# Patient Record
Sex: Male | Born: 1975 | Hispanic: Yes | Smoking: Never smoker
Health system: Southern US, Community
[De-identification: ages and names within clinical notes are randomized; demographics above are authoritative.]

---

## 2012-12-07 ENCOUNTER — Emergency Department: Payer: Self-pay | Admitting: Emergency Medicine

## 2012-12-07 LAB — URINALYSIS, COMPLETE
Bilirubin,UR: NEGATIVE
Glucose,UR: NEGATIVE mg/dL (ref 0–75)
Ketone: NEGATIVE
Nitrite: NEGATIVE
Ph: 5 (ref 4.5–8.0)
Protein: 100
Specific Gravity: 1.029 (ref 1.003–1.030)
Squamous Epithelial: 1
WBC UR: 5 /HPF (ref 0–5)

## 2012-12-07 LAB — COMPREHENSIVE METABOLIC PANEL
Albumin: 4.7 g/dL (ref 3.4–5.0)
Alkaline Phosphatase: 117 U/L (ref 50–136)
Anion Gap: 9 (ref 7–16)
Bilirubin,Total: 0.4 mg/dL (ref 0.2–1.0)
Calcium, Total: 9.2 mg/dL (ref 8.5–10.1)
Creatinine: 1.05 mg/dL (ref 0.60–1.30)
Glucose: 143 mg/dL — ABNORMAL HIGH (ref 65–99)
Osmolality: 279 (ref 275–301)
Potassium: 3.5 mmol/L (ref 3.5–5.1)
SGOT(AST): 28 U/L (ref 15–37)
Sodium: 138 mmol/L (ref 136–145)

## 2012-12-07 LAB — LIPASE, BLOOD: Lipase: 105 U/L (ref 73–393)

## 2012-12-07 LAB — CBC
HGB: 14.5 g/dL (ref 13.0–18.0)
MCH: 30.5 pg (ref 26.0–34.0)
MCHC: 34.2 g/dL (ref 32.0–36.0)
MCV: 89 fL (ref 80–100)
Platelet: 227 10*3/uL (ref 150–440)
RDW: 12.8 % (ref 11.5–14.5)
WBC: 12.6 10*3/uL — ABNORMAL HIGH (ref 3.8–10.6)

## 2014-01-18 IMAGING — US US RENAL KIDNEY
1 series · 14 of 25 positions shown · non-contrast
Comparison: none

REASON FOR EXAM: pain - left flank - hematuria - eval for
hydronephrosis/stone
COMMENTS:

PROCEDURE:     US  - US KIDNEY  - December 07, 2012  [DATE]
RESULT:     Comparison: None.
TECHNIQUE: Multiple grayscale and color Doppler images were obtained of the
bilateral kidneys.

[Series 1: us renal kidney · 0.25mm/px · 14 of 52 slices shown]
[im 1/52]
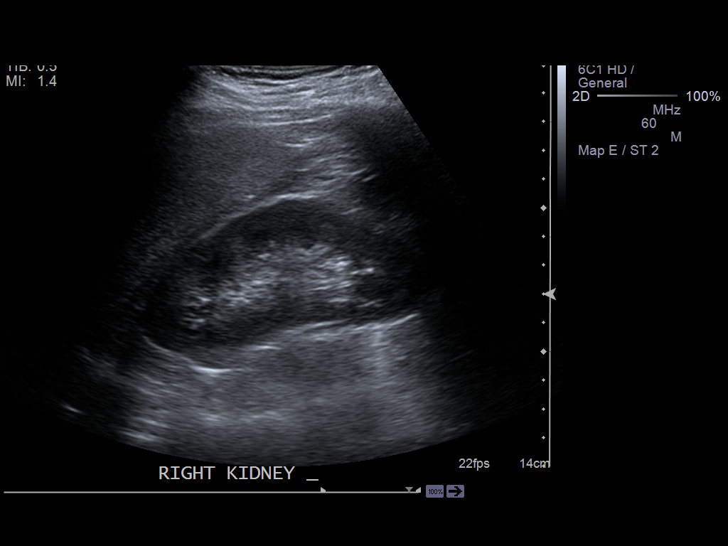
[im 5/52]
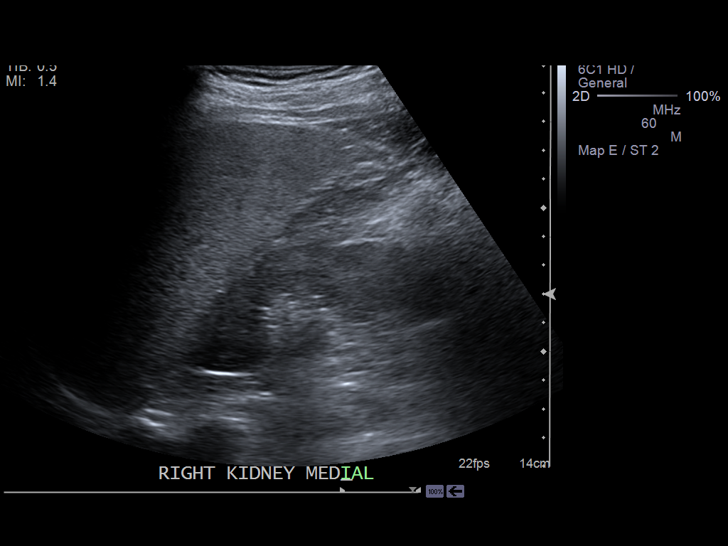
[im 9/52]
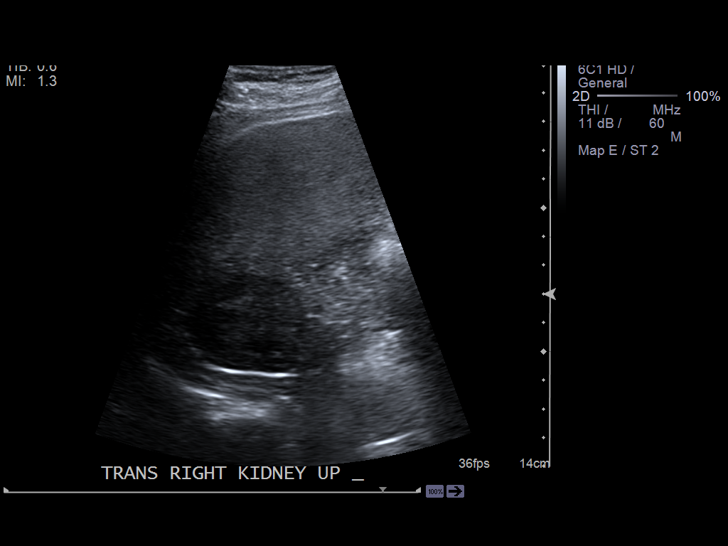
[im 13/52]
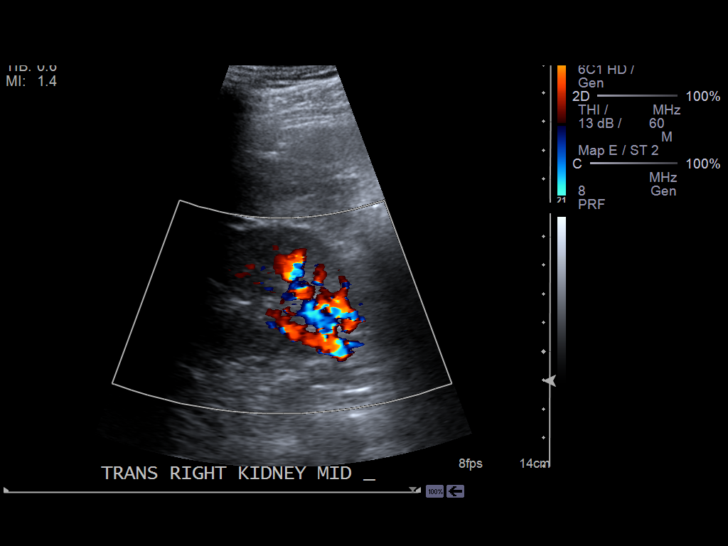
[im 18/52]
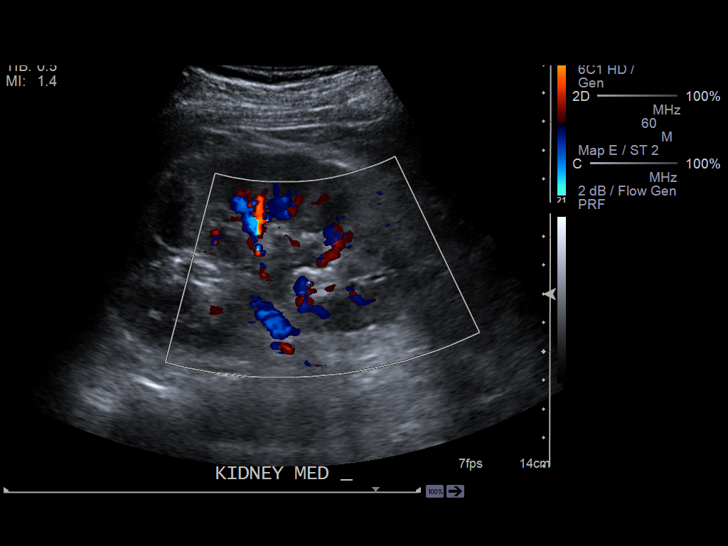
[im 20/52]
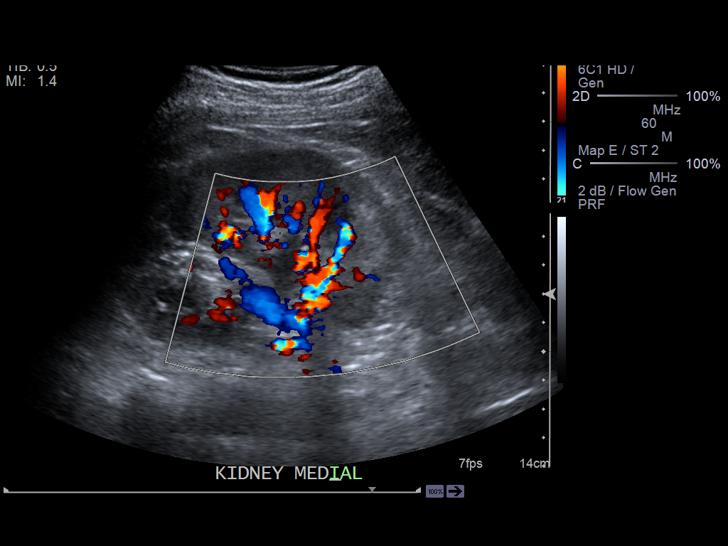
[im 24/52]
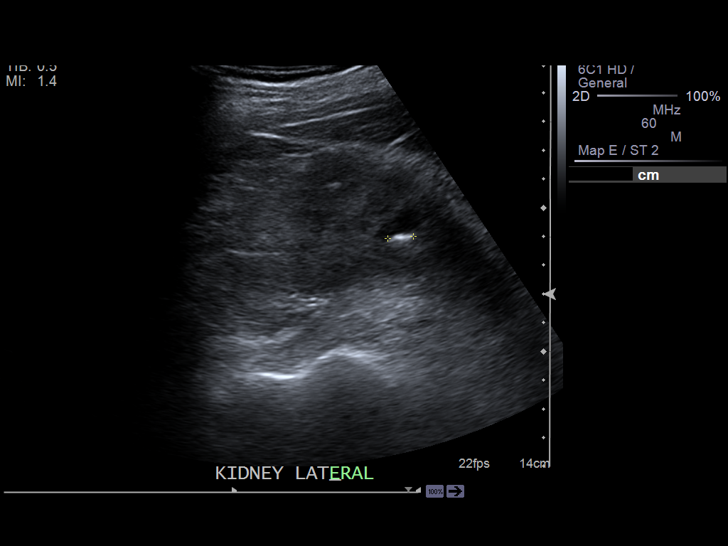
[im 28/52]
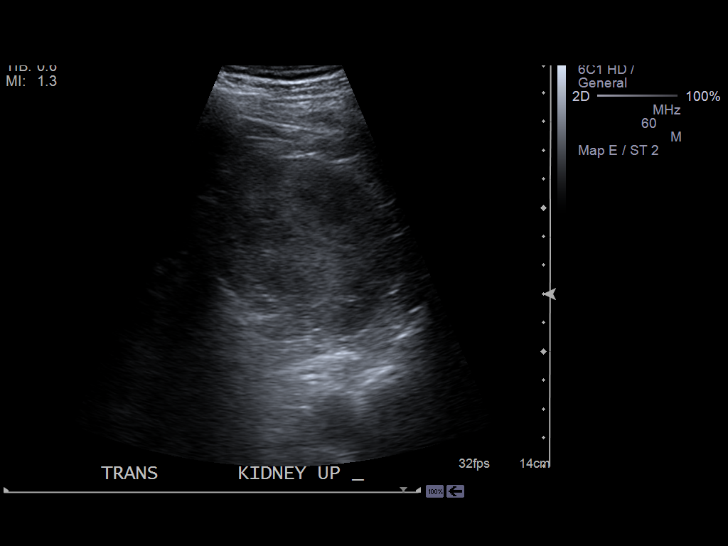
[im 32/52]
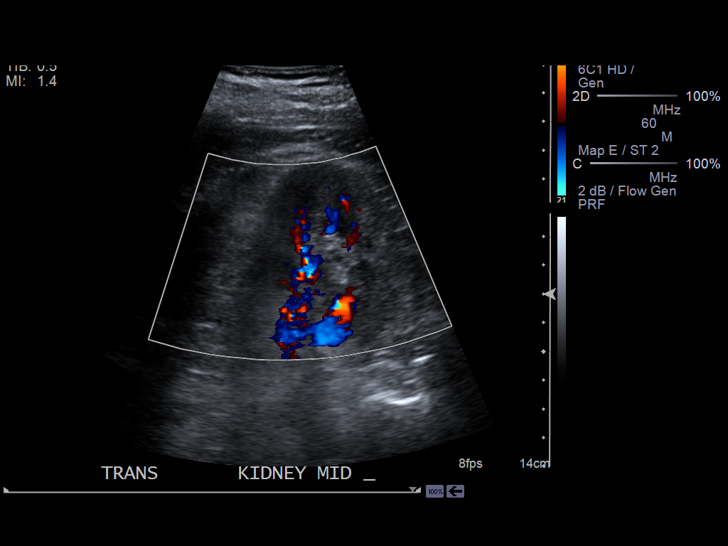
[im 35/52]
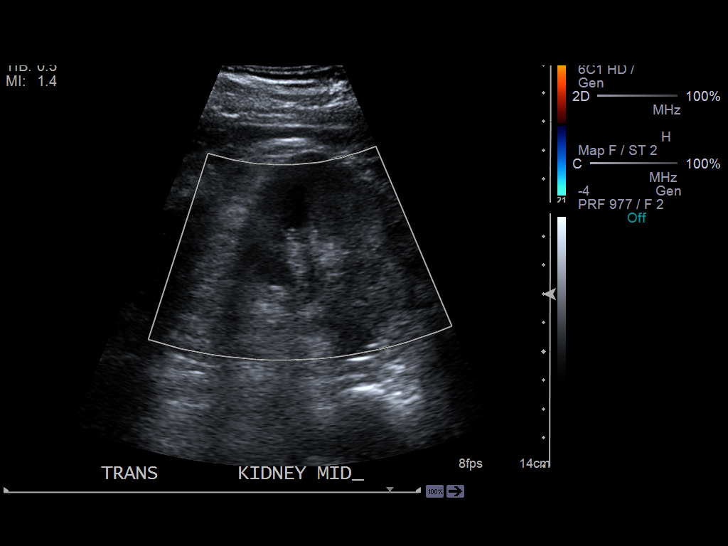
[im 39/52]
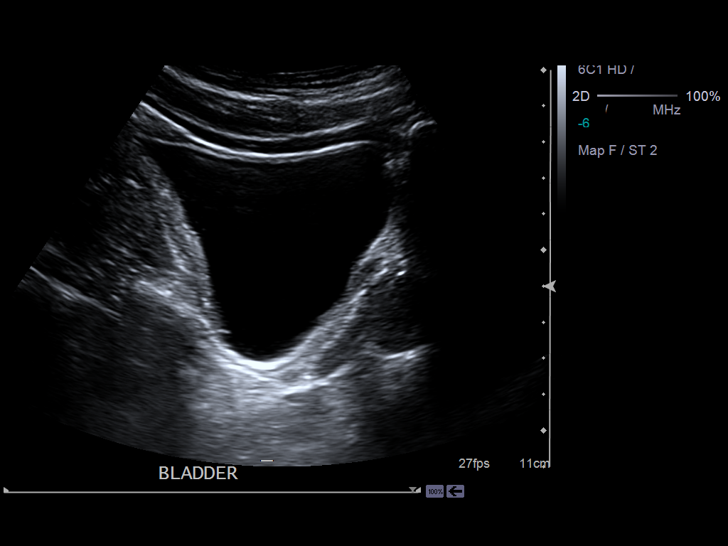
[im 43/52]
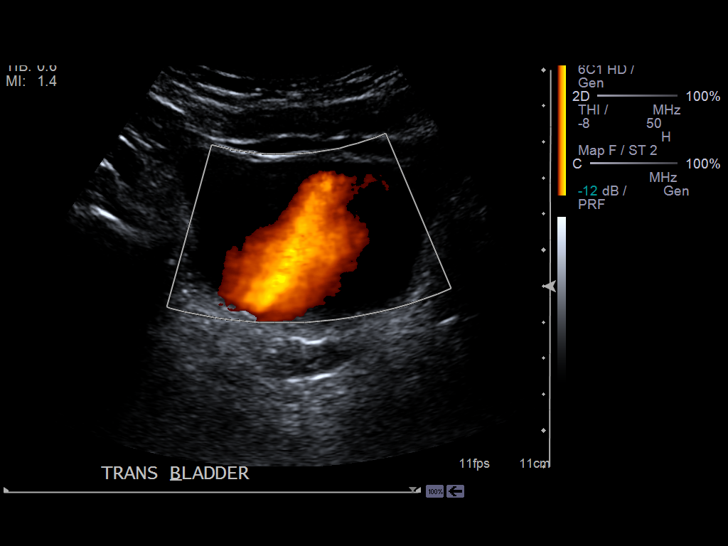
[im 47/52]
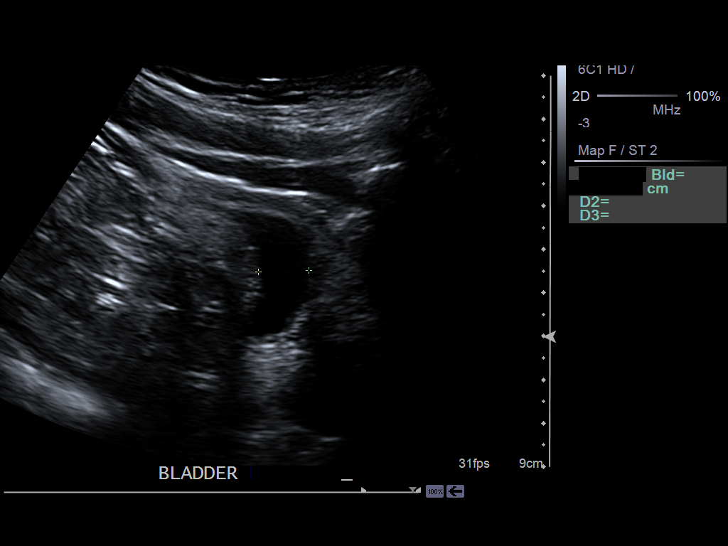
[im 52/52]
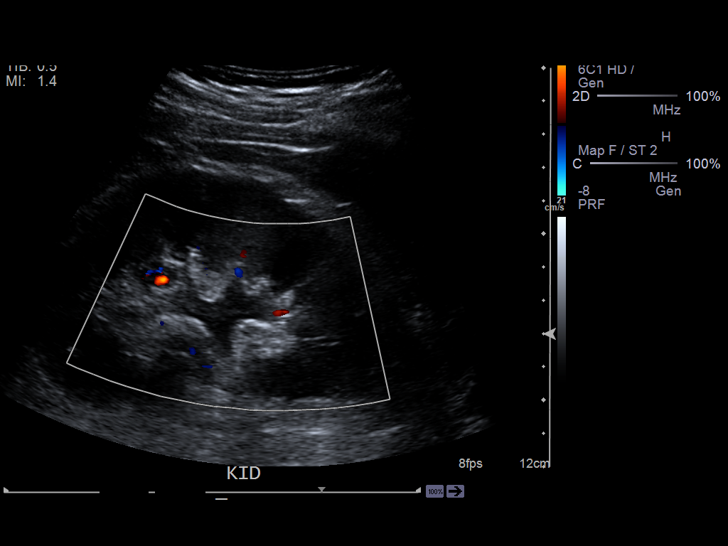

[14 of 25 positions shown; findings below may reference images not displayed]

FINDINGS: The right kidney measures 10.2 x 4.9 x 4.8 cm. The left kidney measures
x 5.0 x 4.6 cm. There is mild pelviectasis in the left kidney. This
persisted on the post void images. There are a few subcentimeter hyperechoic
foci within the left kidney. These do not demonstrate definite shadowing,
but may represent small calculi. The largest measures 9 mm. Bilateral
ureteral jets were seen. The post void volume was 4 mm.
IMPRESSION: 1. Mild pelviectasis, which is nonspecific. Clinical correlation for urinary
obstruction is suggested. Followup renal ultrasound could be performed.
2. Findings which may represent left-sided nephrolithiasis.

[REDACTED]

## 2015-09-11 ENCOUNTER — Emergency Department: Payer: Self-pay

## 2015-09-11 ENCOUNTER — Encounter: Payer: Self-pay | Admitting: Emergency Medicine

## 2015-09-11 ENCOUNTER — Emergency Department
Admission: EM | Admit: 2015-09-11 | Discharge: 2015-09-12 | Disposition: A | Discharge status: Home / Self Care | Payer: Self-pay | Attending: Emergency Medicine | Admitting: Emergency Medicine

## 2015-09-11 DIAGNOSIS — Y9289 Other specified places as the place of occurrence of the external cause: Secondary | ICD-10-CM | POA: Insufficient documentation

## 2015-09-11 DIAGNOSIS — Y990 Civilian activity done for income or pay: Secondary | ICD-10-CM | POA: Insufficient documentation

## 2015-09-11 DIAGNOSIS — Y9389 Activity, other specified: Secondary | ICD-10-CM | POA: Insufficient documentation

## 2015-09-11 DIAGNOSIS — W228XXA Striking against or struck by other objects, initial encounter: Secondary | ICD-10-CM | POA: Insufficient documentation

## 2015-09-11 DIAGNOSIS — S0003XA Contusion of scalp, initial encounter: Secondary | ICD-10-CM | POA: Insufficient documentation

## 2015-09-11 MED ORDER — ACETAMINOPHEN 325 MG PO TABS
ORAL_TABLET | ORAL | Status: AC
  Administered 2015-09-11: 650 mg via ORAL
  Filled 2015-09-11: qty 2

## 2015-09-11 MED ORDER — ACETAMINOPHEN 325 MG PO TABS
650.0000 mg | ORAL_TABLET | Freq: Once | ORAL | Status: AC
  Administered 2015-09-11: 650 mg via ORAL

## 2015-09-11 NOTE — ED Notes (Addendum)
Pt states while he was at work earlier this evening he was hit in the head with a long piece of metal. Redness and swelling noted to top left side of head. Pt alert and answering questions without difficulty. denies dizziness at this time but states was dizzy earlier. Pain was throbbing and he noticed pain to the upper part of his back. Denies loc. Came due to increase in pain to his head.

## 2015-09-11 NOTE — ED Provider Notes (Signed)
[facility] Emergency Department Provider Note  ____________________________________________  Time seen: 11:00 PM  I have reviewed the triage vital signs and the nursing notes.   HISTORY  Chief Complaint Head Injury      HPI Wayne Bass is a 39 y.o. male presents with history of accidental blunt trauma to the left side of his head approximately 6:00 PM today. Patient states while he was working on a car struck on the left side of the head with a long piece of metal. Patient admits to being "dazed at the time of incident but continues to have pain on the left side of his head currently 6 out of 10. She denies any weakness no numbness no nausea no vomiting no gait instability.     Past Medical History None There are no active problems to display for this patient.   Past surgical History None No current outpatient prescriptions on file.  Allergies No known drug allergies No family history on file.  Social History Social History  Substance Use Topics  . Smoking status: Never Smoker   . Smokeless tobacco: Never Used  . Alcohol Use: No    Review of Systems  Constitutional: Negative for fever. Eyes: Negative for visual changes. ENT: Negative for sore throat. Cardiovascular: Negative for chest pain. Respiratory: Negative for shortness of breath. Gastrointestinal: Negative for abdominal pain, vomiting and diarrhea. Genitourinary: Negative for dysuria. Musculoskeletal: Negative for back pain. Skin: Negative for rash. Neurological: Positive for left side headache   10-point ROS otherwise negative.  ____________________________________________   PHYSICAL EXAM:  VITAL SIGNS: ED Triage Vitals  Enc Vitals Group     BP 09/11/15 2215 146/96 mmHg     Pulse Rate 09/11/15 2215 75     Resp 09/11/15 2215 18     Temp 09/11/15 2215 98.2 F (36.8 C)     Temp Source 09/11/15 2215 Oral     SpO2 09/11/15 2215 95 %     Weight --      Height  09/11/15 2215 5' (1.524 m)     Head Cir --      Peak Flow --      Pain Score 09/11/15 2302 7     Pain Loc --      Pain Edu? --      Excl. in GC? --      Constitutional: Alert and oriented. Well appearing and in no distress. Eyes: Conjunctivae are normal. PERRL. Normal extraocular movements. ENT   Head: Normocephalic and atraumatic.   Nose: No congestion/rhinnorhea.   Mouth/Throat: Mucous membranes are moist.   Neck: No stridor. Cardiovascular: Normal rate, regular rhythm. Normal and symmetric distal pulses are present in all extremities. No murmurs, rubs, or gallops. Respiratory: Normal respiratory effort without tachypnea nor retractions. Breath sounds are clear and equal bilaterally. No wheezes/rales/rhonchi. Gastrointestinal: Soft and nontender. No distention. There is no CVA tenderness. Genitourinary: deferred Musculoskeletal: Nontender with normal range of motion in all extremities. No joint effusions.  No lower extremity tenderness nor edema. Neurologic:  Normal speech and language. No gross focal neurologic deficits are appreciated. Speech is normal.  Skin:  Skin is warm, dry and intact. No rash noted. Psychiatric: Mood and affect are normal. Speech and behavior are normal. Patient exhibits appropriate insight and judgment.      RADIOLOGY CT Head Wo Contrast (Final result) Result time: 09/12/15 00:05:51   Final result by Rad Results In Interface (09/12/15 00:05:51)   Narrative:   CLINICAL DATA: Struck in head with  metal at work. LEFT head swelling. No loss of consciousness. Increasing head pain and prior dizziness.  EXAM: CT HEAD WITHOUT CONTRAST  TECHNIQUE: Contiguous axial images were obtained from the base of the skull through the vertex without intravenous contrast.  COMPARISON: None.  FINDINGS: The ventricles and sulci are normal. No intraparenchymal hemorrhage, mass effect nor midline shift. No acute large vascular  territory infarcts.  No abnormal extra-axial fluid collections. Basal cisterns are patent.  Moderate LEFT frontal scalp hematoma without subcutaneous gas or radiopaque foreign bodies. No skull fracture. Probable epidermal inclusion cyst LEFT posterior temporal scalp, benign. The included ocular globes and orbital contents are non-suspicious. The mastoid aircells and included paranasal sinuses are well-aerated.  IMPRESSION: No acute intracranial process; normal noncontrast CT head.  Moderate LEFT frontal scalp hematoma. No skull fracture.   Electronically Signed By: Awilda Metro M.D. On: 09/12/2015 00:05      INITIAL IMPRESSION / ASSESSMENT AND PLAN / ED COURSE  Pertinent labs & imaging results that were available during my care of the patient were reviewed by me and considered in my medical decision making (see chart for details).    ____________________________________________   FINAL CLINICAL IMPRESSION(S) / ED DIAGNOSES  Final diagnoses:  Scalp contusion, initial encounter      Darci Current, MD 09/12/15 [phone]

## 2015-09-12 NOTE — Discharge Instructions (Signed)
Facial or Scalp Contusion A facial or scalp contusion is a deep bruise on the face or head. Injuries to the face and head generally cause a lot of swelling, especially around the eyes. Contusions are the result of an injury that caused bleeding under the skin. The contusion may turn blue, purple, or yellow. Minor injuries will give you a painless contusion, but more severe contusions may stay painful and swollen for a few weeks.  CAUSES  A facial or scalp contusion is caused by a blunt injury or trauma to the face or head area.  SIGNS AND SYMPTOMS   Swelling of the injured area.   Discoloration of the injured area.   Tenderness, soreness, or pain in the injured area.  DIAGNOSIS  The diagnosis can be made by taking a medical history and doing a physical exam. An X-ray exam, CT scan, or MRI may be needed to determine if there are any associated injuries, such as broken bones (fractures). TREATMENT  Often, the best treatment for a facial or scalp contusion is applying cold compresses to the injured area. Over-the-counter medicines may also be recommended for pain control.  HOME CARE INSTRUCTIONS   Only take over-the-counter or prescription medicines as directed by your health care provider.   Apply ice to the injured area.   Put ice in a plastic bag.   Place a towel between your skin and the bag.   Leave the ice on for 20 minutes, 2-3 times a day.  SEEK MEDICAL CARE IF:  You have bite problems.   You have pain with chewing.   You are concerned about facial defects. SEEK IMMEDIATE MEDICAL CARE IF:  You have severe pain or a headache that is not relieved by medicine.   You have unusual sleepiness, confusion, or personality changes.   You throw up (vomit).   You have a persistent nosebleed.   You have double vision or blurred vision.   You have fluid drainage from your nose or ear.   You have difficulty walking or using your arms or legs.  MAKE SURE YOU:    Understand these instructions.  Will watch your condition.  Will get help right away if you are not doing well or get worse. Document Released: 01/09/2005 Document Revised: 09/22/2013 Document Reviewed: 07/15/2013 [employer] Patient Information 2015 [employer]. This information is not intended to replace advice given to you by your health care provider. Make sure you discuss any questions you have with your health care provider.  

## 2016-10-22 IMAGING — CT CT HEAD W/O CM
2 series · 14 of 30 positions shown, 16 images · non-contrast
Comparison: None.

CLINICAL DATA: Struck in head with metal at work. LEFT head
swelling. No loss of consciousness. Increasing head pain and prior
dizziness.

EXAM:
CT HEAD WITHOUT CONTRAST
TECHNIQUE: Contiguous axial images were obtained from the base of the skull
through the vertex without intravenous contrast.

[Series 2: head wo · axial · 0.44mm/px · z∈[-127,-28]mm · 6 of 32 slices shown, 8 images]
[im 5/32  brain]
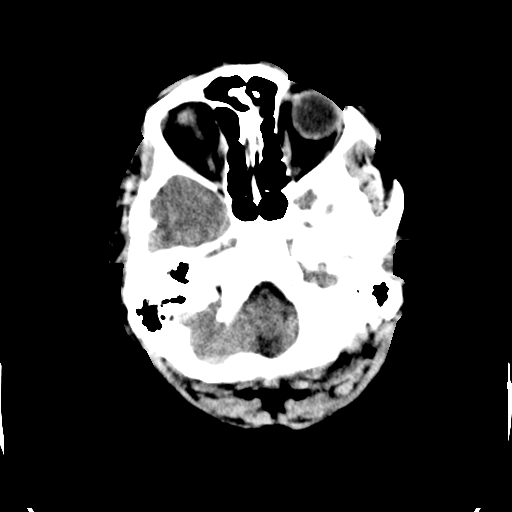
[im 5/32  bone]
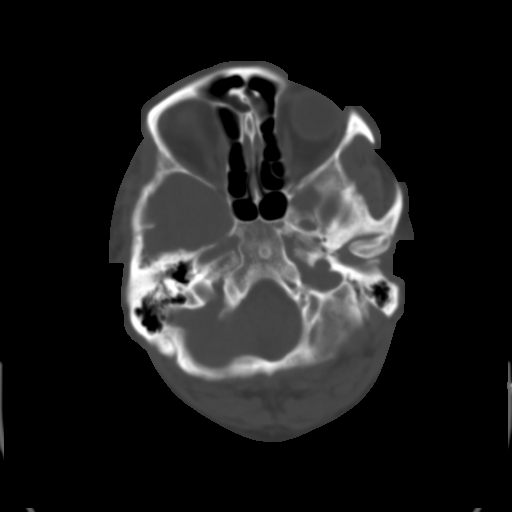
[im 9/32  brain]
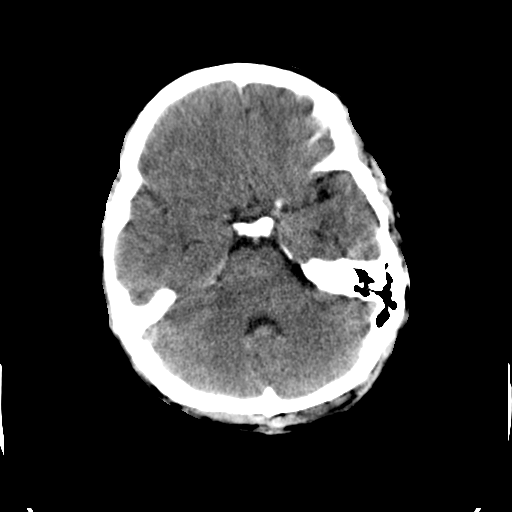
[im 14/32  brain]
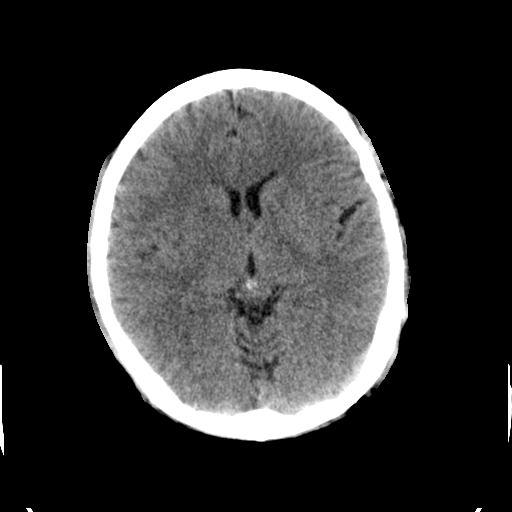
[im 18/32  brain]
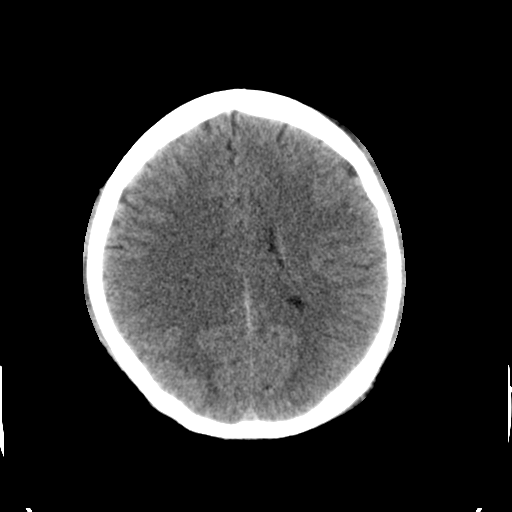
[im 23/32  brain]
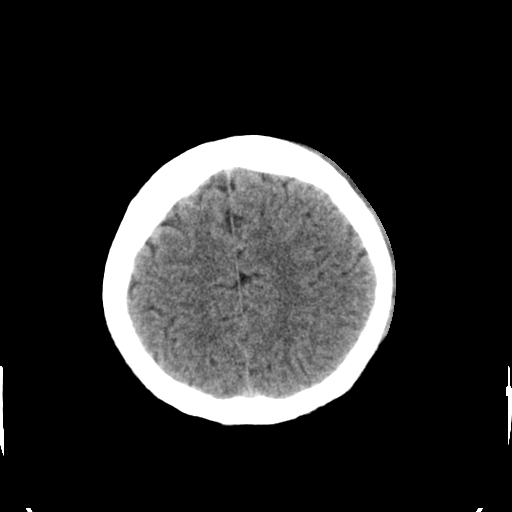
[im 23/32  bone]
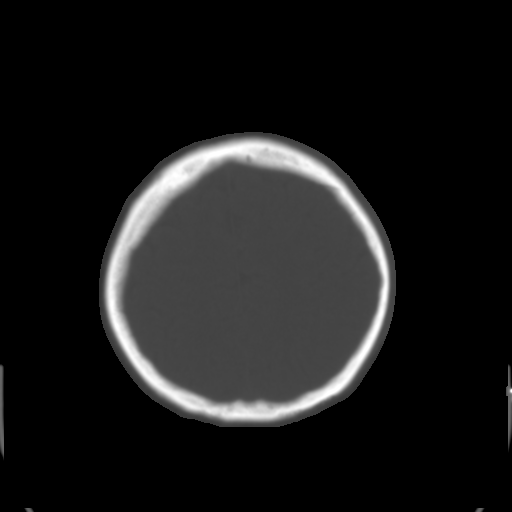
[im 27/32  brain]
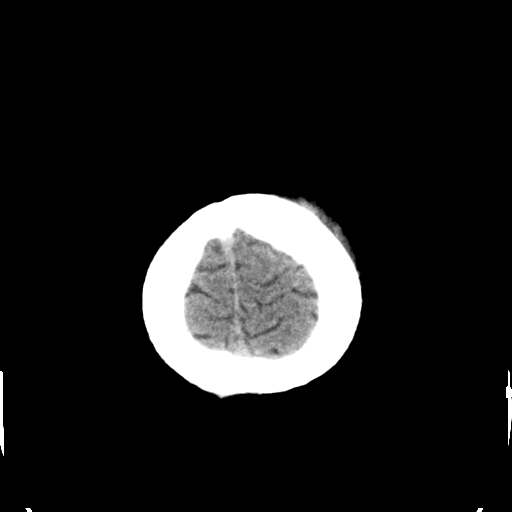

[Series 3: head bone · axial · 0.44mm/px · z∈[-133,-19]mm · 8 of 96 slices shown]
[im 10/96  bone]
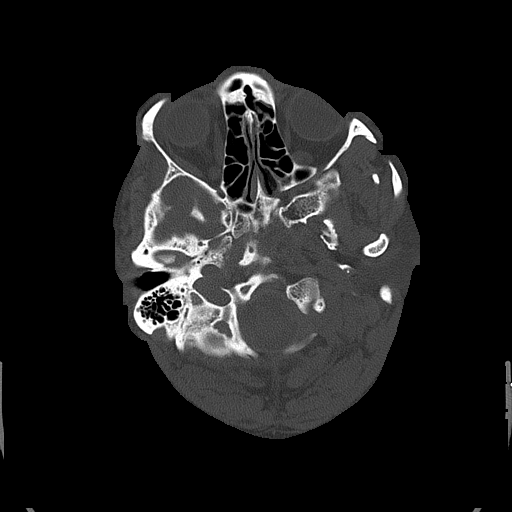
[im 19/96  bone]
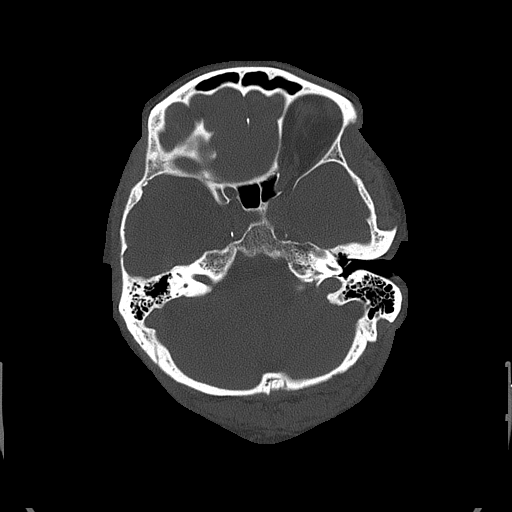
[im 32/96  bone]
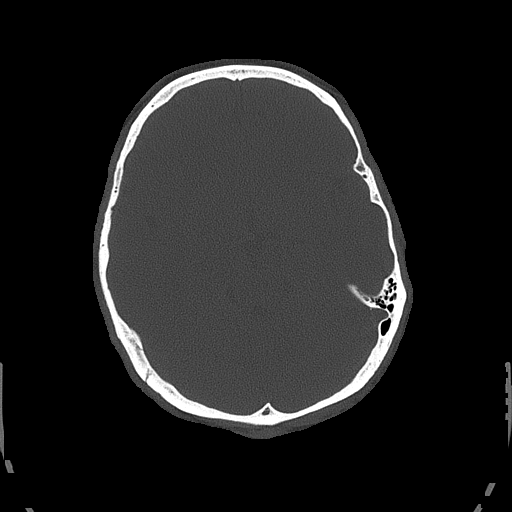
[im 41/96  bone]
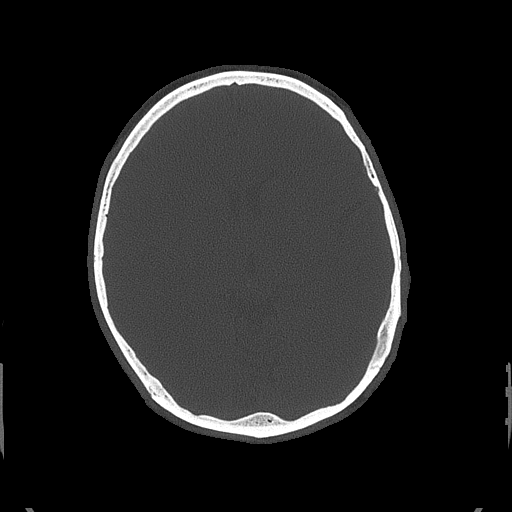
[im 55/96  bone]
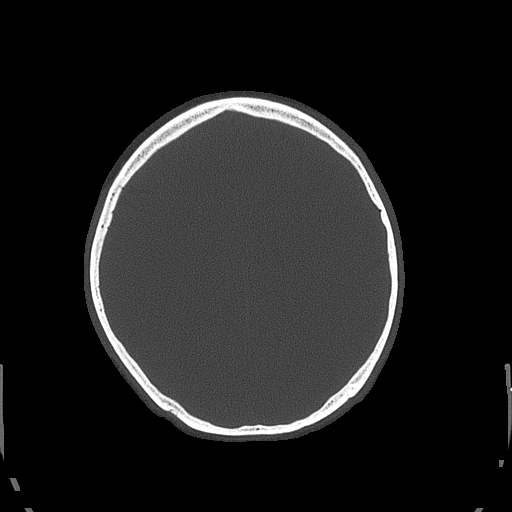
[im 64/96  bone]
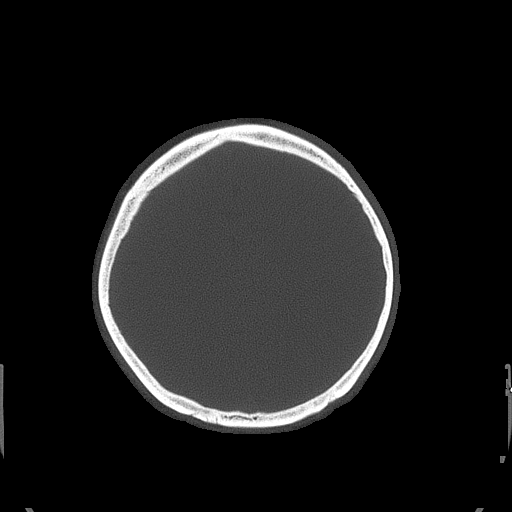
[im 77/96  bone]
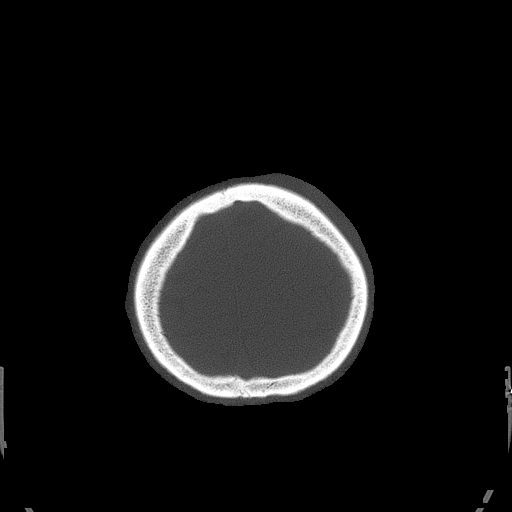
[im 86/96  bone]
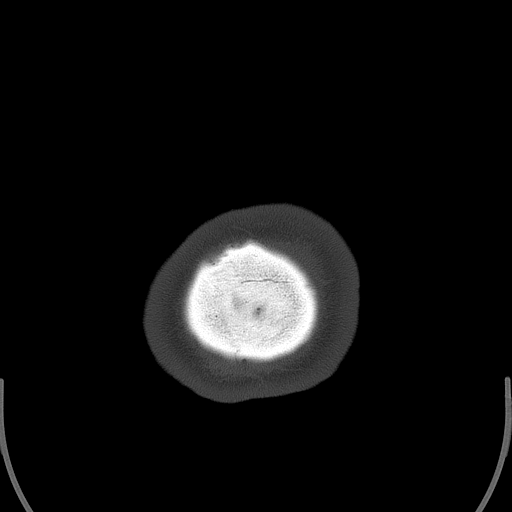

[14 of 30 positions shown; findings below may reference images not displayed]

FINDINGS: The ventricles and sulci are normal. No intraparenchymal hemorrhage,
mass effect nor midline shift. No acute large vascular territory
infarcts.

No abnormal extra-axial fluid collections. Basal cisterns are
patent.

Moderate LEFT frontal scalp hematoma without subcutaneous gas or
radiopaque foreign bodies. No skull fracture. Probable epidermal
inclusion cyst LEFT posterior temporal scalp, benign. The included
ocular globes and orbital contents are non-suspicious. The mastoid
aircells and included paranasal sinuses are well-aerated.
IMPRESSION: No acute intracranial process; normal noncontrast CT head.

Moderate LEFT frontal scalp hematoma.  No skull fracture.
